# Patient Record
Sex: Male | Born: 1955 | Race: White | Hispanic: No | Marital: Single | Smoking: Never smoker
Health system: Southern US, Community
[De-identification: ages and names within clinical notes are randomized; demographics above are authoritative.]

## PROBLEM LIST (undated history)

## (undated) DIAGNOSIS — B182 Chronic viral hepatitis C: Secondary | ICD-10-CM

## (undated) DIAGNOSIS — K7460 Unspecified cirrhosis of liver: Secondary | ICD-10-CM

## (undated) HISTORY — PX: FOOT FRACTURE SURGERY: SHX645

## (undated) HISTORY — DX: Unspecified cirrhosis of liver: K74.60

## (undated) HISTORY — PX: FACIAL LACERATIONS REPAIR: SHX1571

## (undated) HISTORY — PX: COLONOSCOPY: SHX174

---

## 2001-02-02 ENCOUNTER — Encounter: Admission: RE | Admit: 2001-02-02 | Discharge: 2001-02-02 | Payer: Self-pay | Admitting: Gastroenterology

## 2001-02-02 ENCOUNTER — Encounter: Payer: Self-pay | Admitting: Gastroenterology

## 2004-02-29 ENCOUNTER — Encounter: Admission: RE | Admit: 2004-02-29 | Discharge: 2004-02-29 | Payer: Self-pay | Admitting: Gastroenterology

## 2004-07-22 ENCOUNTER — Ambulatory Visit: Payer: Self-pay | Admitting: Gastroenterology

## 2004-08-07 ENCOUNTER — Ambulatory Visit (HOSPITAL_COMMUNITY): Admission: RE | Admit: 2004-08-07 | Discharge: 2004-08-07 | Payer: Self-pay | Admitting: Gastroenterology

## 2004-08-07 ENCOUNTER — Encounter (INDEPENDENT_AMBULATORY_CARE_PROVIDER_SITE_OTHER): Payer: Self-pay | Admitting: Specialist

## 2005-07-17 ENCOUNTER — Ambulatory Visit: Payer: Self-pay | Admitting: Gastroenterology

## 2007-08-30 ENCOUNTER — Ambulatory Visit: Payer: Self-pay | Admitting: Gastroenterology

## 2008-02-16 ENCOUNTER — Ambulatory Visit: Payer: Self-pay | Admitting: Gastroenterology

## 2012-08-22 ENCOUNTER — Other Ambulatory Visit: Payer: Self-pay | Admitting: Otolaryngology

## 2012-09-20 ENCOUNTER — Other Ambulatory Visit: Payer: Self-pay | Admitting: Internal Medicine

## 2012-09-20 DIAGNOSIS — B182 Chronic viral hepatitis C: Secondary | ICD-10-CM

## 2012-09-26 ENCOUNTER — Other Ambulatory Visit (HOSPITAL_COMMUNITY): Payer: Self-pay | Admitting: Internal Medicine

## 2012-09-26 DIAGNOSIS — B182 Chronic viral hepatitis C: Secondary | ICD-10-CM

## 2012-09-26 DIAGNOSIS — R160 Hepatomegaly, not elsewhere classified: Secondary | ICD-10-CM

## 2012-09-27 ENCOUNTER — Other Ambulatory Visit: Payer: Self-pay | Admitting: Radiology

## 2012-09-27 ENCOUNTER — Ambulatory Visit
Admission: RE | Admit: 2012-09-27 | Discharge: 2012-09-27 | Disposition: A | Discharge status: Home / Self Care | Payer: BC Managed Care – PPO | Source: Ambulatory Visit | Attending: Internal Medicine | Admitting: Internal Medicine

## 2012-09-27 ENCOUNTER — Other Ambulatory Visit (HOSPITAL_COMMUNITY): Payer: Self-pay | Admitting: Internal Medicine

## 2012-09-27 DIAGNOSIS — B182 Chronic viral hepatitis C: Secondary | ICD-10-CM

## 2012-09-30 ENCOUNTER — Encounter (HOSPITAL_COMMUNITY): Payer: Self-pay

## 2012-09-30 ENCOUNTER — Ambulatory Visit (HOSPITAL_COMMUNITY)
Admission: RE | Admit: 2012-09-30 | Discharge: 2012-09-30 | Disposition: A | Discharge status: Home / Self Care | Payer: BC Managed Care – PPO | Source: Ambulatory Visit | Attending: Internal Medicine | Admitting: Internal Medicine

## 2012-09-30 DIAGNOSIS — Z87891 Personal history of nicotine dependence: Secondary | ICD-10-CM | POA: Insufficient documentation

## 2012-09-30 DIAGNOSIS — R160 Hepatomegaly, not elsewhere classified: Secondary | ICD-10-CM

## 2012-09-30 DIAGNOSIS — B182 Chronic viral hepatitis C: Secondary | ICD-10-CM | POA: Insufficient documentation

## 2012-09-30 HISTORY — DX: Chronic viral hepatitis C: B18.2

## 2012-09-30 LAB — CBC
HCT: 46.3 % (ref 39.0–52.0)
MCH: 31.6 pg (ref 26.0–34.0)
MCHC: 35 g/dL (ref 30.0–36.0)
MCV: 90.4 fL (ref 78.0–100.0)
Platelets: 163 10*3/uL (ref 150–400)
RDW: 13.1 % (ref 11.5–15.5)

## 2012-09-30 MED ORDER — SODIUM CHLORIDE 0.9 % IV SOLN
Freq: Once | INTRAVENOUS | Status: AC
  Administered 2012-09-30: 08:00:00 via INTRAVENOUS

## 2012-09-30 MED ORDER — FENTANYL CITRATE 0.05 MG/ML IJ SOLN
INTRAMUSCULAR | Status: AC | PRN
  Administered 2012-09-30: 50 ug via INTRAVENOUS

## 2012-09-30 MED ORDER — MIDAZOLAM HCL 2 MG/2ML IJ SOLN
INTRAMUSCULAR | Status: AC
  Filled 2012-09-30: qty 4

## 2012-09-30 MED ORDER — FENTANYL CITRATE 0.05 MG/ML IJ SOLN
INTRAMUSCULAR | Status: AC
  Filled 2012-09-30: qty 4

## 2012-09-30 MED ORDER — MIDAZOLAM HCL 2 MG/2ML IJ SOLN
INTRAMUSCULAR | Status: AC | PRN
  Administered 2012-09-30: 1 mg via INTRAVENOUS

## 2012-09-30 MED ORDER — OXYCODONE-ACETAMINOPHEN 5-325 MG PO TABS
2.0000 | ORAL_TABLET | Freq: Once | ORAL | Status: AC
  Administered 2012-09-30: 2 via ORAL

## 2012-09-30 MED ORDER — OXYCODONE-ACETAMINOPHEN 5-325 MG PO TABS
ORAL_TABLET | ORAL | Status: AC
  Filled 2012-09-30: qty 2

## 2012-09-30 NOTE — Procedures (Signed)
Technically successful US guided biopsy of the right lobe of the liver.  No immediate complications.

## 2012-09-30 NOTE — ED Notes (Signed)
Requested SS bed from [place], [place].  Third on list.

## 2012-09-30 NOTE — H&P (Signed)
Richard Coffey is an 57 y.o. male.   Chief Complaint: notice Liver fxn test increase on routine PE 7 ys ago Was dx with Hep C then Was placed on Interferon injections x 11 months Followed off and on  Now with Dr Richard Coffey and referred to GI MD Scheduled now for liver core biopsy HPI: Hep C  Past Medical History  Diagnosis Date  . Hep C w/o coma, chronic     Past Surgical History  Procedure Laterality Date  . Facial lacerations repair    . Foot fracture surgery      History reviewed. No pertinent family history. Social History:  reports that he has quit smoking. He does not have any smokeless tobacco history on file. His alcohol and drug histories are not on file.  Allergies: No Known Allergies   (Not in a hospital admission)  Results for orders placed during the hospital encounter of 09/30/12 (from the past 48 hour(s))  APTT     Status: None   Collection Time    09/30/12  8:08 AM      Result Value Range   aPTT 30  24 - 37 seconds  CBC     Status: None   Collection Time    09/30/12  8:08 AM      Result Value Range   WBC 6.2  4.0 - 10.5 K/uL   RBC 5.12  4.22 - 5.81 MIL/uL   Hemoglobin 16.2  13.0 - 17.0 g/dL   HCT 57.3  22.0 - 25.4 %   MCV 90.4  78.0 - 100.0 fL   MCH 31.6  26.0 - 34.0 pg   MCHC 35.0  30.0 - 36.0 g/dL   RDW 27.0  62.3 - 76.2 %   Platelets 163  150 - 400 K/uL  PROTIME-INR     Status: None   Collection Time    09/30/12  8:08 AM      Result Value Range   Prothrombin Time 12.5  11.6 - 15.2 seconds   INR 0.94  0.00 - 1.49   No results found.  Review of Systems  Constitutional: Negative for fever.  Respiratory: Positive for shortness of breath.   Cardiovascular: Negative for chest pain.  Gastrointestinal: Negative for nausea and vomiting.  Neurological: Negative for weakness and headaches.    Blood pressure 112/73, pulse 70, temperature 97.3 F (36.3 C), temperature source Oral, resp. rate 18, height 6\' 1"  (1.854 m), weight 196 lb (88.905 kg),  SpO2 96.00%. Physical Exam  Constitutional: He is oriented to person, place, and time.  Cardiovascular: Normal rate, regular rhythm and normal heart sounds.   No murmur heard. Respiratory: Effort normal and breath sounds normal. He has no wheezes.  GI: Soft. Bowel sounds are normal. There is no tenderness.  Musculoskeletal: Normal range of motion.  Neurological: He is alert and oriented to person, place, and time. No cranial nerve deficit.  Psychiatric: He has a normal mood and affect. His behavior is normal. Judgment and thought content normal.     Assessment/Plan Hep C Scheduled for liver core bx now Pt aware of procedure benefits and risks and agreeable to proceed Consent signed and in chart  Richard Coffey A 09/30/2012, 9:27 AM

## 2013-06-22 IMAGING — US US ABDOMEN LIMITED
1 series · 14 of 25 positions shown · non-contrast
Comparison: Abdominal ultrasound 02/29/2004.

CLINICAL DATA: Chronic hepatitis C infection.  Hepatic
surveillance.

LIMITED ABDOMINAL ULTRASOUND - RIGHT UPPER QUADRANT

[Series 1: us abdomen limited · 0.26mm/px · 14 of 50 slices shown]
[im 1/50]
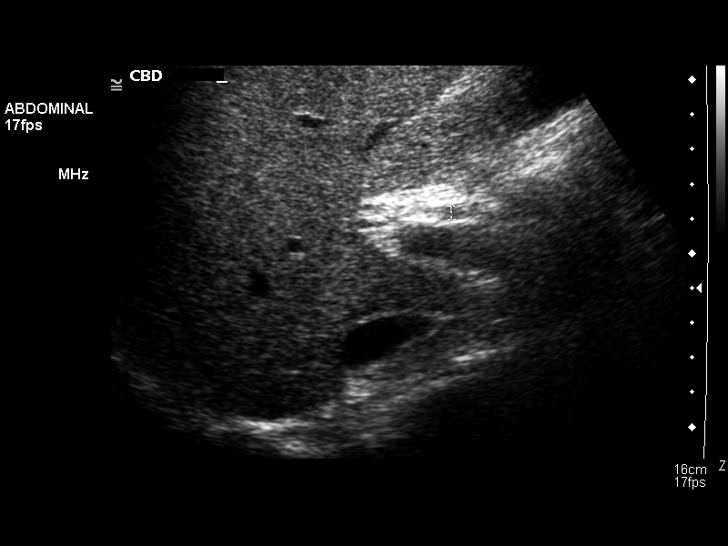
[im 5/50]
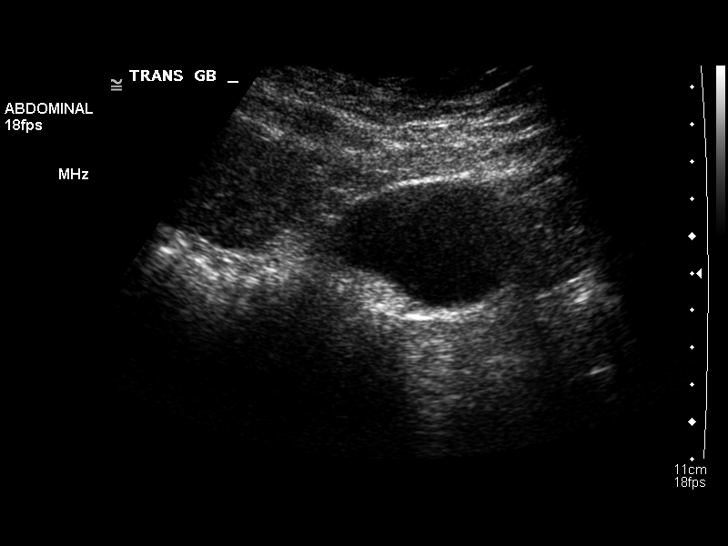
[im 9/50]
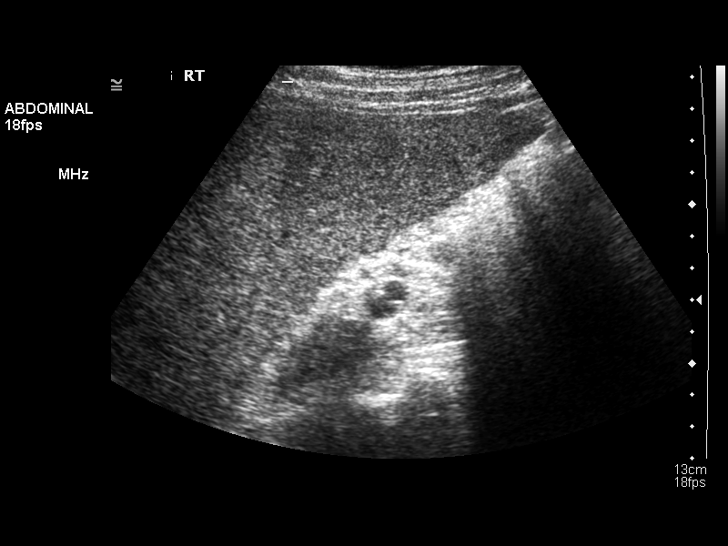
[im 13/50]
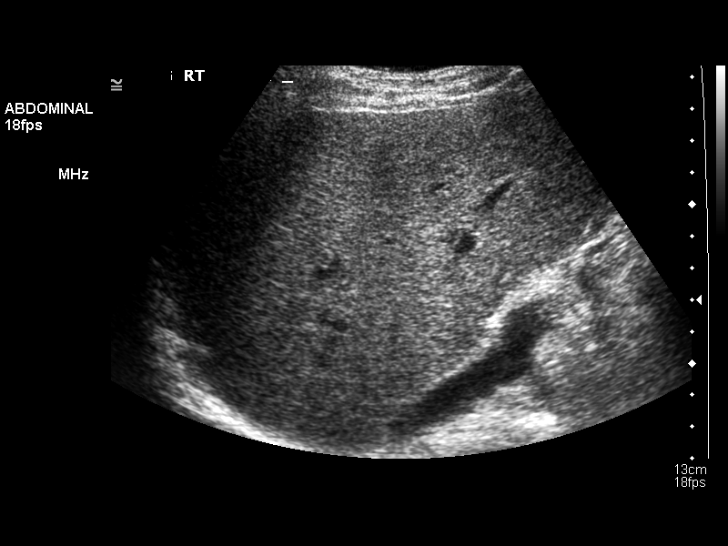
[im 17/50]
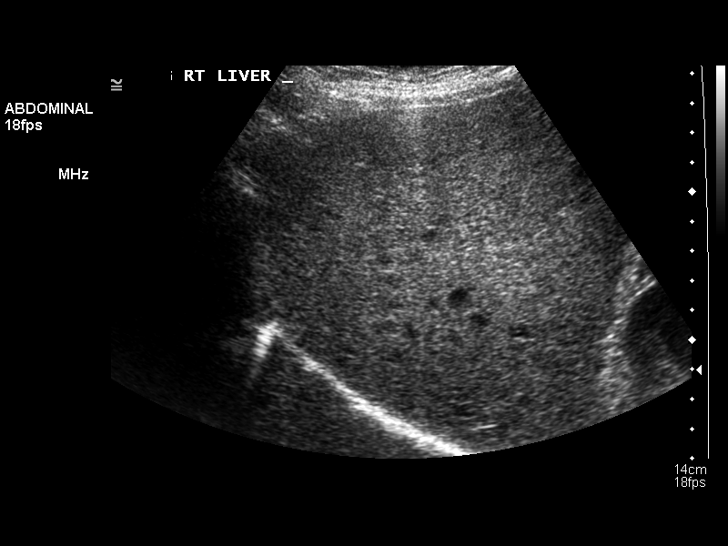
[im 19/50]
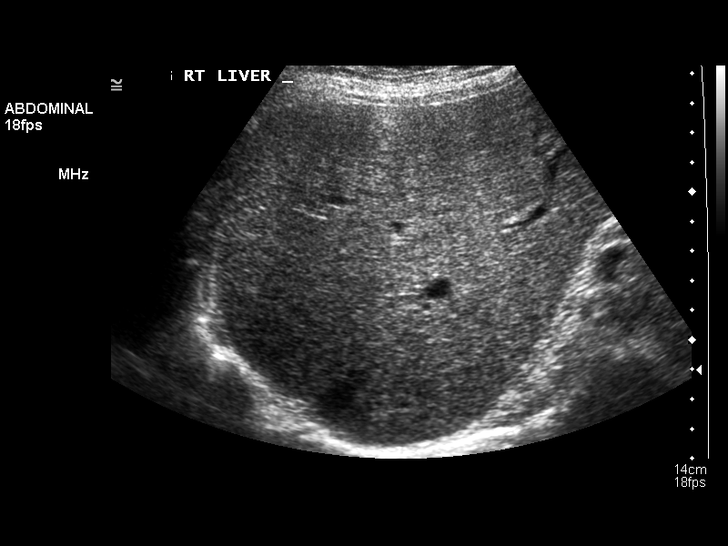
[im 23/50]
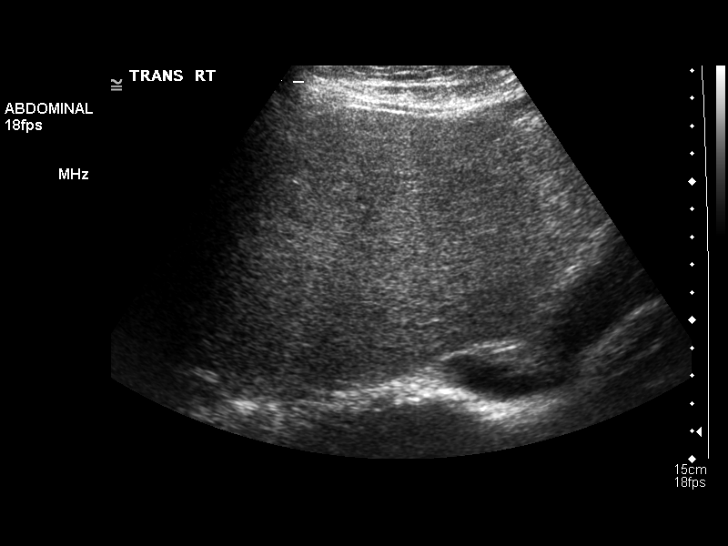
[im 27/50]
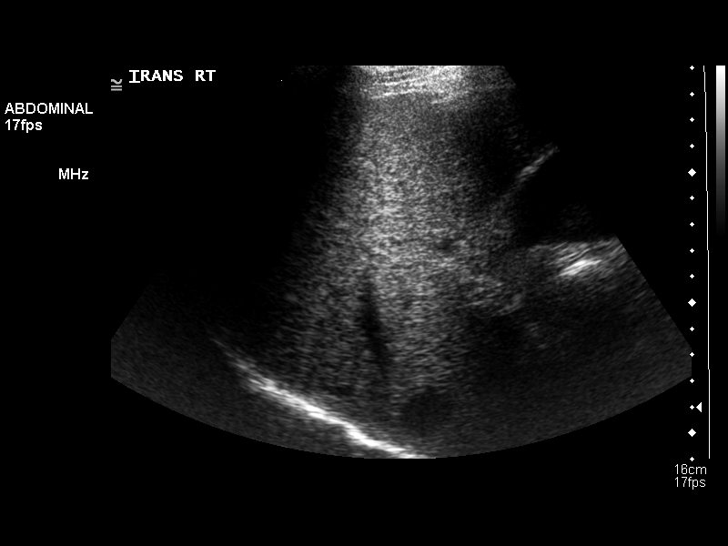
[im 31/50]
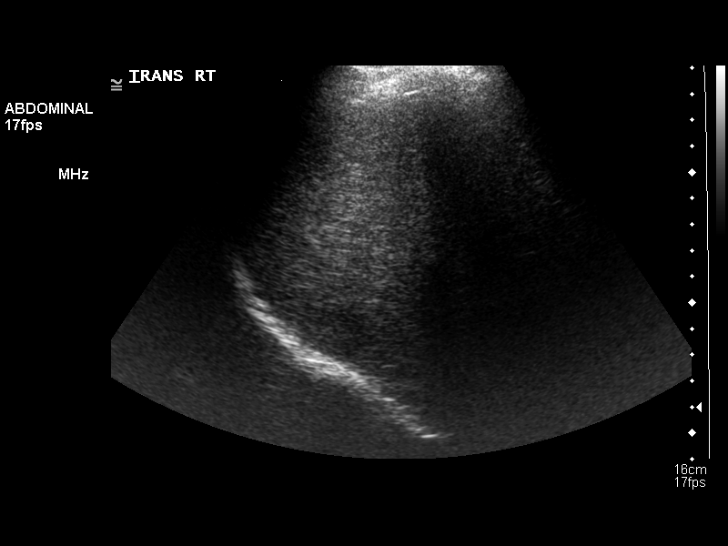
[im 33/50]
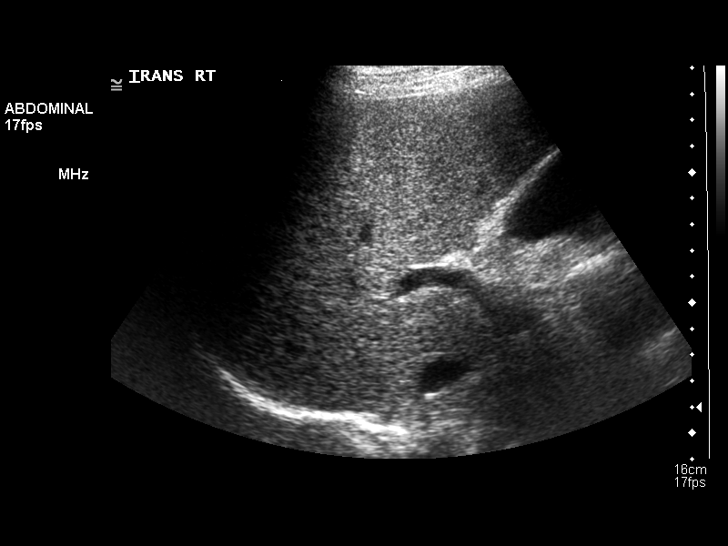
[im 37/50]
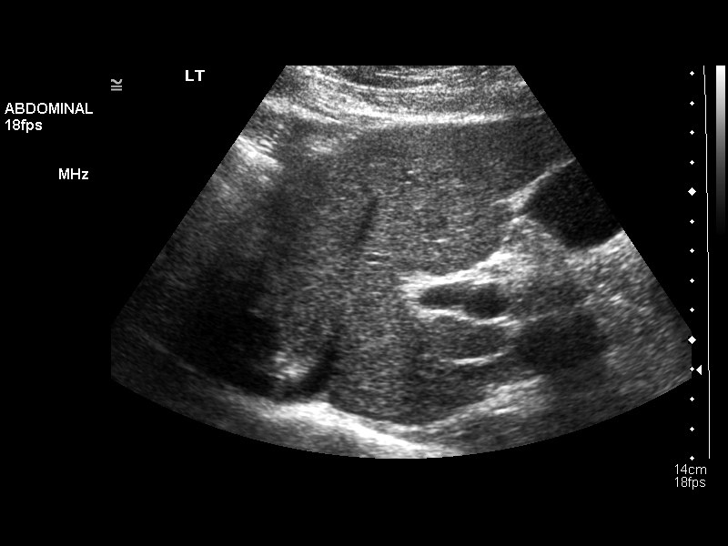
[im 41/50]
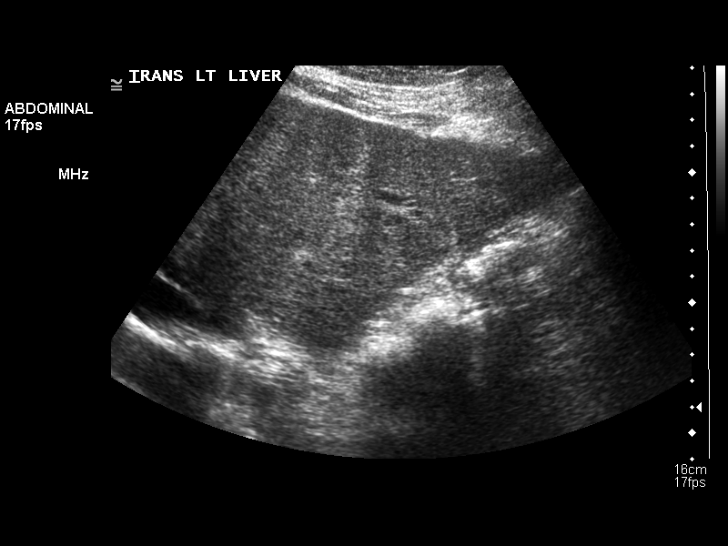
[im 45/50]
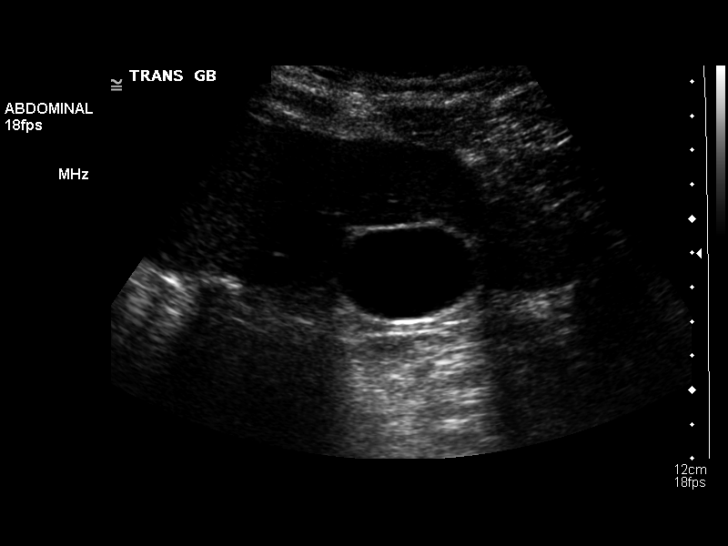
[im 50/50]
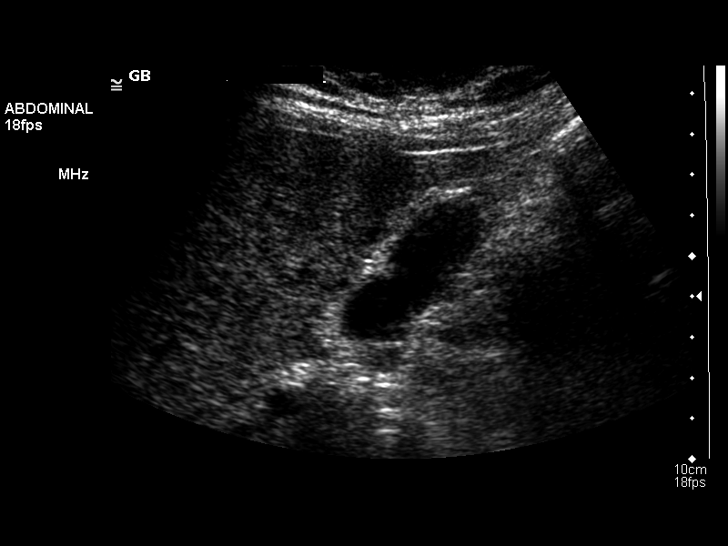

[14 of 25 positions shown; findings below may reference images not displayed]

FINDINGS: Gallbladder:  The gallbladder is well distended.  No gallstones are
identified.  There is no gallbladder wall thickening or sonographic
Murphy's sign.

Common bile duct:  Normal in caliber without filling defects.

Liver:  The hepatic echotexture is mildly heterogeneous without
focal lesion.  There is mild hepatic contour irregularity.  No
significant perihepatic ascites is identified.
IMPRESSION: Mild hepatic contour irregularity and heterogeneity consistent with
cirrhosis.  No dominant lesion identified by ultrasound.

## 2013-08-09 ENCOUNTER — Other Ambulatory Visit: Payer: Self-pay | Admitting: Internal Medicine

## 2013-08-09 DIAGNOSIS — C220 Liver cell carcinoma: Secondary | ICD-10-CM

## 2013-08-10 ENCOUNTER — Ambulatory Visit
Admission: RE | Admit: 2013-08-10 | Discharge: 2013-08-10 | Disposition: A | Discharge status: Home / Self Care | Payer: BC Managed Care – PPO | Source: Ambulatory Visit | Attending: Internal Medicine | Admitting: Internal Medicine

## 2013-08-10 DIAGNOSIS — C220 Liver cell carcinoma: Secondary | ICD-10-CM

## 2013-11-22 ENCOUNTER — Other Ambulatory Visit: Payer: Self-pay | Admitting: Nurse Practitioner

## 2013-11-22 DIAGNOSIS — C220 Liver cell carcinoma: Secondary | ICD-10-CM

## 2013-11-28 ENCOUNTER — Ambulatory Visit
Admission: RE | Admit: 2013-11-28 | Discharge: 2013-11-28 | Disposition: A | Discharge status: Home / Self Care | Payer: BC Managed Care – PPO | Source: Ambulatory Visit | Attending: Nurse Practitioner | Admitting: Nurse Practitioner

## 2013-11-28 DIAGNOSIS — C220 Liver cell carcinoma: Secondary | ICD-10-CM

## 2014-09-04 ENCOUNTER — Other Ambulatory Visit (HOSPITAL_COMMUNITY): Payer: Self-pay | Admitting: Nurse Practitioner

## 2014-09-04 DIAGNOSIS — B182 Chronic viral hepatitis C: Secondary | ICD-10-CM

## 2014-09-05 ENCOUNTER — Ambulatory Visit (HOSPITAL_COMMUNITY): Admission: RE | Admit: 2014-09-05 | Payer: BLUE CROSS/BLUE SHIELD | Source: Ambulatory Visit

## 2014-10-09 ENCOUNTER — Ambulatory Visit (HOSPITAL_COMMUNITY): Payer: BLUE CROSS/BLUE SHIELD

## 2014-12-26 ENCOUNTER — Other Ambulatory Visit: Payer: Self-pay | Admitting: Nurse Practitioner

## 2014-12-26 DIAGNOSIS — C220 Liver cell carcinoma: Secondary | ICD-10-CM

## 2014-12-28 ENCOUNTER — Ambulatory Visit
Admission: RE | Admit: 2014-12-28 | Discharge: 2014-12-28 | Disposition: A | Discharge status: Home / Self Care | Payer: BLUE CROSS/BLUE SHIELD | Source: Ambulatory Visit | Attending: Nurse Practitioner | Admitting: Nurse Practitioner

## 2014-12-28 DIAGNOSIS — C220 Liver cell carcinoma: Secondary | ICD-10-CM

## 2015-01-22 ENCOUNTER — Encounter: Payer: Self-pay | Admitting: Physician Assistant

## 2015-02-05 ENCOUNTER — Other Ambulatory Visit (INDEPENDENT_AMBULATORY_CARE_PROVIDER_SITE_OTHER): Payer: BLUE CROSS/BLUE SHIELD

## 2015-02-05 ENCOUNTER — Encounter: Payer: Self-pay | Admitting: Physician Assistant

## 2015-02-05 ENCOUNTER — Ambulatory Visit (INDEPENDENT_AMBULATORY_CARE_PROVIDER_SITE_OTHER): Payer: BLUE CROSS/BLUE SHIELD | Admitting: Physician Assistant

## 2015-02-05 VITALS — BP 110/72 | HR 56 | Ht 73.0 in | Wt 194.6 lb

## 2015-02-05 DIAGNOSIS — K7469 Other cirrhosis of liver: Secondary | ICD-10-CM | POA: Diagnosis not present

## 2015-02-05 DIAGNOSIS — B1920 Unspecified viral hepatitis C without hepatic coma: Secondary | ICD-10-CM

## 2015-02-05 LAB — HEPATIC FUNCTION PANEL
ALT: 132 U/L — ABNORMAL HIGH (ref 0–53)
AST: 100 U/L — AB (ref 0–37)
Albumin: 3.9 g/dL (ref 3.5–5.2)
Alkaline Phosphatase: 83 U/L (ref 39–117)
Bilirubin, Direct: 0.1 mg/dL (ref 0.0–0.3)
Total Bilirubin: 0.7 mg/dL (ref 0.2–1.2)
Total Protein: 7.7 g/dL (ref 6.0–8.3)

## 2015-02-05 LAB — PROTIME-INR
INR: 1 ratio (ref 0.8–1.0)
Prothrombin Time: 11.5 s (ref 9.6–13.1)

## 2015-02-05 NOTE — Patient Instructions (Signed)
You have been scheduled for an endoscopy. Please follow written instructions given to you at your visit today. If you use inhalers (even only as needed), please bring them with you on the day of your procedure. Your physician has requested that you go to www.startemmi.com and enter the access code given to you at your visit today. This web site gives a general overview about your procedure. However, you should still follow specific instructions given to you by our office regarding your preparation for the procedure.  Your physician has requested that you go to the basement for lab work before leaving today.   
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
## 2015-02-06 ENCOUNTER — Encounter: Payer: Self-pay | Admitting: Internal Medicine

## 2015-02-06 ENCOUNTER — Ambulatory Visit (AMBULATORY_SURGERY_CENTER): Payer: BLUE CROSS/BLUE SHIELD | Admitting: Internal Medicine

## 2015-02-06 VITALS — BP 97/69 | HR 61 | Temp 97.4°F | Resp 14 | Ht 73.0 in | Wt 194.0 lb

## 2015-02-06 DIAGNOSIS — K7469 Other cirrhosis of liver: Secondary | ICD-10-CM | POA: Diagnosis not present

## 2015-02-06 DIAGNOSIS — B182 Chronic viral hepatitis C: Secondary | ICD-10-CM

## 2015-02-06 MED ORDER — SODIUM CHLORIDE 0.9 % IV SOLN: 500.0000 mL | INTRAVENOUS | Status: DC

## 2015-02-06 NOTE — Op Note (Signed)
[place]  [address] [place], [address]   ENDOSCOPY PROCEDURE REPORT  PATIENT: Talton, Delpriore  MR#: [id number] BIRTHDATE: 1955/11/03 , 74  yrs. old GENDER: male ENDOSCOPIST: Jerene Bears, MD REFERRED BY:   [name], NP PROCEDURE DATE:  02/06/2015 PROCEDURE:  EGD, screening ASA CLASS:     Class III INDICATIONS:  variceal screening, history of hepatitis C cirrhosis.  MEDICATIONS: Monitored anesthesia care and Propofol 200 mg IV TOPICAL ANESTHETIC: none  DESCRIPTION OF PROCEDURE: After the risks benefits and alternatives of the procedure were thoroughly explained, informed consent was obtained.  The LB [id number] endoscope was introduced through the mouth and advanced to the second portion of the duodenum , Without limitations.  The instrument was slowly withdrawn as the mucosa was fully examined.      EXAM: The esophagus and gastroesophageal junction were completely normal in appearance.  The stomach was entered and closely examined.The antrum, angularis, and lesser curvature were well visualized, including a retroflexed view of the cardia and fundus. The stomach wall was normally distensible.  The scope passed easily through the pylorus into the duodenum.  Retroflexed views revealed no abnormalities.     The scope was then withdrawn from the patient and the procedure completed.  COMPLICATIONS: There were no immediate complications.  ENDOSCOPIC IMPRESSION: Normal EGD with no evidence of gastric or esophageal varices  RECOMMENDATIONS: Repeat EGD in 3 years for variceal screening   eSigned:  Jerene Bears, MD 02/06/2015 2:15 PM    HW:YSHUOH, Denton Ar MD and The Patient, [name], NP

## 2015-02-06 NOTE — Patient Instructions (Signed)
YOU HAD AN ENDOSCOPIC PROCEDURE TODAY AT [facility]:   Refer to the procedure report that was given to you for any specific questions about what was found during the examination.  If the procedure report does not answer your questions, please call your gastroenterologist to clarify.  If you requested that your care partner not be given the details of your procedure findings, then the procedure report has been included in a sealed envelope for you to review at your convenience later.  YOU SHOULD EXPECT: Some feelings of bloating in the abdomen. Passage of more gas than usual.  Walking can help get rid of the air that was put into your GI tract during the procedure and reduce the bloating. If you had a lower endoscopy (such as a colonoscopy or flexible sigmoidoscopy) you may notice spotting of blood in your stool or on the toilet paper. If you underwent a bowel prep for your procedure, you may not have a normal bowel movement for a few days.  Please Note:  You might notice some irritation and congestion in your nose or some drainage.  This is from the oxygen used during your procedure.  There is no need for concern and it should clear up in a day or so.  SYMPTOMS TO REPORT IMMEDIATELY:     Following upper endoscopy (EGD)  Vomiting of blood or coffee ground material  New chest pain or pain under the shoulder blades  Painful or persistently difficult swallowing  New shortness of breath  Fever of 100F or higher  Black, tarry-looking stools  For urgent or emergent issues, a gastroenterologist can be reached at any hour by calling [phone].   DIET: Your first meal following the procedure should be a small meal and then it is ok to progress to your normal diet. Heavy or fried foods are harder to digest and may make you feel nauseous or bloated.  Likewise, meals heavy in dairy and vegetables can increase bloating.  Drink plenty of fluids but you should avoid alcoholic beverages  for 24 hours.  ACTIVITY:  You should plan to take it easy for the rest of today and you should NOT DRIVE or use heavy machinery until tomorrow (because of the sedation medicines used during the test).    FOLLOW UP: Our staff will call the number listed on your records the next business day following your procedure to check on you and address any questions or concerns that you may have regarding the information given to you following your procedure. If we do not reach you, we will leave a message.  However, if you are feeling well and you are not experiencing any problems, there is no need to return our call.  We will assume that you have returned to your regular daily activities without incident.  If any biopsies were taken you will be contacted by phone or by letter within the next 1-3 weeks.  Please call us at [phone] if you have not heard about the biopsies in 3 weeks.    SIGNATURES/CONFIDENTIALITY: You and/or your care partner have signed paperwork which will be entered into your electronic medical record.  These signatures attest to the fact that that the information above on your After Visit Summary has been reviewed and is understood.  Full responsibility of the confidentiality of this discharge information lies with you and/or your care-partner.  Repeat EGD in 3 years.

## 2015-02-06 NOTE — Progress Notes (Signed)
Transferred to recovery room. A/O x3, pleased with MAC.  VSS.  Report to Jane, RN. 

## 2015-02-07 ENCOUNTER — Telehealth: Payer: Self-pay | Admitting: *Deleted

## 2015-02-07 NOTE — Telephone Encounter (Signed)
  Follow up Call-  Call back number 02/06/2015  Post procedure Call Back phone  # [phone]  Permission to leave phone message Yes     Patient questions:  Do you have a fever, pain , or abdominal swelling? No. Pain Score  0 *  Have you tolerated food without any problems? Yes.    Have you been able to return to your normal activities? Yes.    Do you have any questions about your discharge instructions: Diet   No. Medications  No. Follow up visit  No.  Do you have questions or concerns about your Care? No.  Actions: * If pain score is 4 or above: No action needed, pain <4.

## 2015-04-22 ENCOUNTER — Ambulatory Visit (INDEPENDENT_AMBULATORY_CARE_PROVIDER_SITE_OTHER): Payer: BLUE CROSS/BLUE SHIELD | Admitting: Emergency Medicine

## 2015-04-22 VITALS — BP 122/74 | HR 85 | Temp 98.3°F | Resp 17 | Ht 71.0 in | Wt 200.0 lb

## 2015-04-22 DIAGNOSIS — R1310 Dysphagia, unspecified: Secondary | ICD-10-CM

## 2015-04-22 NOTE — Progress Notes (Signed)
Subjective:  Patient ID: Richard Coffey, male    DOB: 1955-11-09  Age: 59 y.o. MRN: [id number]  CC: Sore Throat   HPI Richard Coffey presents  with a sensation that he has a swelling in the throat that's "like swollen tonsils". He has no dysphagia or weight loss his appetite is normal. That his symptoms began after he had an upper endoscopy. Michela Pitcher that he has a fullness just about the level of his thyroid actually way describes it. He has no symptoms suggestive of reflux. No indigestion or heartburn nausea vomiting cough or water brash  History Jakhari has a past medical history of Hep C w/o coma, chronic ([place]) and Cirrhosis ([place]).   He has past surgical history that includes Facial lacerations repair; Foot fracture surgery; and Colonoscopy.   His  family history includes Lung cancer in his father.  He   reports that he has never smoked. He has never used smokeless tobacco. He reports that he does not drink alcohol or use illicit drugs.  No outpatient prescriptions prior to visit.   No facility-administered medications prior to visit.    Social History   Social History  . Marital Status: Single    Spouse Name: N/A  . Number of Children: 1  . Years of Education: N/A   Social History Main Topics  . Smoking status: Never Smoker   . Smokeless tobacco: Never Used  . Alcohol Use: No     Comment: Quit 10 years ago  . Drug Use: No  . Sexual Activity: Not Asked   Other Topics Concern  . None   Social History Narrative     Review of Systems  Constitutional: Negative for fever, chills and appetite change.  HENT: Negative for congestion, ear pain, postnasal drip, sinus pressure and sore throat.   Eyes: Negative for pain and redness.  Respiratory: Negative for cough, shortness of breath and wheezing.   Cardiovascular: Negative for leg swelling.  Gastrointestinal: Negative for nausea, vomiting, abdominal pain, diarrhea, constipation and blood in stool.  Endocrine:  Negative for polyuria.  Genitourinary: Negative for dysuria, urgency, frequency and flank pain.  Musculoskeletal: Negative for gait problem.  Skin: Negative for rash.  Neurological: Negative for weakness and headaches.  Psychiatric/Behavioral: Negative for confusion and decreased concentration. The patient is not nervous/anxious.     Objective:  BP 122/74 mmHg  Pulse 85  Temp(Src) 98.3 F (36.8 C) (Oral)  Resp 17  Ht 5\' 11"  (1.803 m)  Wt 200 lb (90.719 kg)  BMI 27.91 kg/m2  SpO2 97%  Physical Exam  Constitutional: He is oriented to person, place, and time. He appears well-developed and well-nourished. No distress.  HENT:  Head: Normocephalic and atraumatic.  Right Ear: External ear normal.  Left Ear: External ear normal.  Nose: Nose normal.  Eyes: Conjunctivae and EOM are normal. Pupils are equal, round, and reactive to light. No scleral icterus.  Neck: Normal range of motion. Neck supple. No tracheal deviation present.  Cardiovascular: Normal rate, regular rhythm and normal heart sounds.   Pulmonary/Chest: Effort normal. No respiratory distress. He has no wheezes. He has no rales.  Abdominal: He exhibits no mass. There is no tenderness. There is no rebound and no guarding.  Musculoskeletal: He exhibits no edema.  Lymphadenopathy:    He has no cervical adenopathy.  Neurological: He is alert and oriented to person, place, and time. Coordination normal.  Skin: Skin is warm and dry. No rash noted.  Psychiatric: He has a normal  mood and affect. His behavior is normal.      Assessment & Plan:   Neo was seen today for sore throat.  Diagnoses and all orders for this visit:  Dysphagia  Mr. Huitron does not currently have medications on file.  No orders of the defined types were placed in this encounter.    Appropriate red flag conditions were discussed with the patient as well as actions that should be taken.  Patient expressed his understanding.  Follow-up: Return  if symptoms worsen or fail to improve.  Roselee Culver, MD

## 2015-04-22 NOTE — Patient Instructions (Signed)
Hepatitis C  Hepatitis C is a viral infection of the liver. It can lead to scarring of the liver (cirrhosis), liver failure, or liver cancer. Hepatitis C may go undetected for months or years because people with the infection may not have symptoms, or they may have only mild symptoms.  CAUSES   Hepatitis C is caused by the hepatitis C virus (HCV). The virus can be passed from one person to another through:   Blood.   Contaminated needles, such as those used for tattooing, body piercing, acupuncture, or injecting drugs.   Having unprotected sex with an infected person.   Childbirth.   Blood transfusions or organ transplants done in the [place] before 1992.  RISK FACTORS  Risk factors for hepatitis C include:   Having unprotected sex with an infected person.   Using illegal drugs.  SIGNS AND SYMPTOMS   Symptoms of hepatitis C may include:   Fatigue.   Loss of appetite.   Nausea.   Vomiting.   Abdominal pain.   Dark yellow urine.   Yellowish skin and eyes (jaundice).   Itching of the skin.   Clay-colored bowel movements.   Joint pain.  Symptoms are not always present.   DIAGNOSIS   Hepatitis C is diagnosed with blood tests. Other types of tests may also be done to check how your liver is functioning.  TREATMENT   Your health care provider may perform noninvasive tests or a liver biopsy to help determine the best course of treatment. Treatment for hepatitis C may include one or more medicines. Your health care provider may check you for a recurring infection or other liver conditions every 6-12 months after treatment.  HOME CARE INSTRUCTIONS    Rest as needed.   Take all medicines as directed by your health care provider.   Do not take any medicine unless approved by your health care provider. This includes over-the-counter medicine and birth control pills.   Do not drink alcohol.   Do not have sex until approved by your health care provider.   Do not share toothbrushes, nail clippers,  razors, or needles.  PREVENTION  There is no vaccine for hepatitis C. The only way to prevent the disease is to reduce the risk of exposure to the virus. This may be done by:   Practicing safe sex and using condoms.   Avoiding illegal drugs.  SEEK MEDICAL CARE IF:   You have a fever.   You develop abdominal pain.   You develop dark urine.   You have clay-colored bowel movements.   You develop joint pains.  SEEK IMMEDIATE MEDICAL CARE IF:   You have increasing fatigue or weakness.   You lose your appetite.   You feel nauseous or vomit.   You develop jaundice or your jaundice gets worse.   You bruise or bleed easily.  MAKE SURE YOU:    Understand these instructions.   Will watch your condition.   Will get help right away if you are not doing well or get worse.     This information is not intended to replace advice given to you by your health care provider. Make sure you discuss any questions you have with your health care provider.     Document Released: 06/19/2000 Document Revised: 07/13/2014 Document Reviewed: 10/04/2013  Elsevier Interactive Patient Education 2016 [employer]

## 2015-05-20 ENCOUNTER — Ambulatory Visit (INDEPENDENT_AMBULATORY_CARE_PROVIDER_SITE_OTHER): Payer: BLUE CROSS/BLUE SHIELD | Admitting: Internal Medicine

## 2015-05-20 ENCOUNTER — Encounter: Payer: Self-pay | Admitting: Internal Medicine

## 2015-05-20 VITALS — BP 116/68 | HR 68 | Ht 71.0 in | Wt 201.2 lb

## 2015-05-20 DIAGNOSIS — K7460 Unspecified cirrhosis of liver: Secondary | ICD-10-CM | POA: Diagnosis not present

## 2015-05-20 DIAGNOSIS — F458 Other somatoform disorders: Secondary | ICD-10-CM

## 2015-05-20 DIAGNOSIS — B182 Chronic viral hepatitis C: Secondary | ICD-10-CM | POA: Diagnosis not present

## 2015-05-20 DIAGNOSIS — R0989 Other specified symptoms and signs involving the circulatory and respiratory systems: Secondary | ICD-10-CM

## 2015-05-20 DIAGNOSIS — R198 Other specified symptoms and signs involving the digestive system and abdomen: Secondary | ICD-10-CM

## 2015-05-20 NOTE — Patient Instructions (Signed)
Please follow up as needed.  Call our office if you notice the lump in your throat again.

## 2015-05-20 NOTE — Progress Notes (Signed)
   Subjective:    Patient ID: Richard Coffey, male    DOB: 1955-08-06, 59 y.o.   MRN: [id number]  HPI [name] is a 59 year old male with a past medical history of hepatitis C cirrhosis followed by [facility] who is seen in follow-up to evaluate globus sensation. He was seen in consultation at the request of [name], NP for upper endoscopy to evaluate and screen for varices. This exam was performed on 02/06/2015. It was normal with no evidence of varices. He reports after this time he developed globus sensation and a mild sore throat. Globus sensation persisted for nearly 3 months. He was seen in urgent care and told to come back to GI. During this time he was having throat clearing but no true dysphagia or odynophagia. Symptoms became more intermittent and over the last several weeks of been gone completely. He now feels well but wanted to follow-up because he was concerned during the months he was having issues with globus. He denies heartburn currently. Has not lost weight. No nausea or vomiting. No early satiety. Bowel movements been regular without blood in his stool or melena.   Review of Systems As per history of present illness, otherwise negative  Current Medications, Allergies, Past Medical History, Past Surgical History, Family History and Social History were reviewed in [employer] record.     Objective:   Physical Exam BP 116/68 mmHg  Pulse 68  Ht 5\' 11"  (1.803 m)  Wt 201 lb 4 oz (91.286 kg)  BMI 28.08 kg/m2 Constitutional: Well-developed and well-nourished. No distress. HEENT: Normocephalic and atraumatic. Oropharynx is clear and moist. No oropharyngeal exudate. Conjunctivae are normal.  No scleral icterus. Neck: Neck supple. Trachea midline. Cardiovascular: Normal rate, regular rhythm and intact distal pulses. No M/R/G Pulmonary/chest: Effort normal and breath sounds normal. No wheezing, rales or rhonchi. Abdominal: Soft, nontender,  nondistended. Bowel sounds active throughout.  Extremities: no clubbing, cyanosis, or edema Lymphadenopathy: No cervical adenopathy noted. Neurological: Alert and oriented to person place and time. Skin: Skin is warm and dry. No rashes noted. Psychiatric: Normal mood and affect. Behavior is normal.     Assessment & Plan:   59 year old male with a past medical history of hepatitis C cirrhosis followed by [facility] who is seen in follow-up to evaluate globus sensation.  1. Globus sensation -- this is likely a manifestation of reflux though it has resolved entirely at present. We discussed globus including etiologies. He is not having heartburn and given symptoms have resolved I will not start PPI. He is asked to notify me should symptoms recur. He voices understanding and feels reassured.  2. Hepatitis C cirrhosis -- up-to-date with variceal screening, repeat EGD recommended in 3 years. No varices seen recently. He will continue to follow with [facility] liver clinic for this issue  3. CRC screening -- up-to-date with screening colonoscopy. No polyps were found on screening exam and he was advised 10 year follow-up.  Follow-up as needed, sooner if necessary

## 2015-06-26 ENCOUNTER — Other Ambulatory Visit: Payer: Self-pay | Admitting: Nurse Practitioner

## 2015-06-26 DIAGNOSIS — K7460 Unspecified cirrhosis of liver: Secondary | ICD-10-CM

## 2015-07-02 ENCOUNTER — Ambulatory Visit
Admission: RE | Admit: 2015-07-02 | Discharge: 2015-07-02 | Disposition: A | Discharge status: Home / Self Care | Payer: BLUE CROSS/BLUE SHIELD | Source: Ambulatory Visit | Attending: Nurse Practitioner | Admitting: Nurse Practitioner

## 2015-07-02 DIAGNOSIS — K7460 Unspecified cirrhosis of liver: Secondary | ICD-10-CM

## 2015-12-05 DIAGNOSIS — K7469 Other cirrhosis of liver: Secondary | ICD-10-CM | POA: Diagnosis not present

## 2015-12-05 DIAGNOSIS — B182 Chronic viral hepatitis C: Secondary | ICD-10-CM | POA: Diagnosis not present

## 2015-12-06 ENCOUNTER — Other Ambulatory Visit: Payer: Self-pay | Admitting: Nurse Practitioner

## 2015-12-06 DIAGNOSIS — K7460 Unspecified cirrhosis of liver: Secondary | ICD-10-CM

## 2015-12-10 ENCOUNTER — Ambulatory Visit
Admission: RE | Admit: 2015-12-10 | Discharge: 2015-12-10 | Disposition: A | Discharge status: Home / Self Care | Payer: BLUE CROSS/BLUE SHIELD | Source: Ambulatory Visit | Attending: Nurse Practitioner | Admitting: Nurse Practitioner

## 2015-12-10 DIAGNOSIS — K7460 Unspecified cirrhosis of liver: Secondary | ICD-10-CM

## 2016-06-01 DIAGNOSIS — R7301 Impaired fasting glucose: Secondary | ICD-10-CM | POA: Diagnosis not present

## 2016-06-01 DIAGNOSIS — Z23 Encounter for immunization: Secondary | ICD-10-CM | POA: Diagnosis not present

## 2016-06-01 DIAGNOSIS — Z0000 Encounter for general adult medical examination without abnormal findings: Secondary | ICD-10-CM | POA: Diagnosis not present

## 2016-06-01 DIAGNOSIS — E782 Mixed hyperlipidemia: Secondary | ICD-10-CM | POA: Diagnosis not present

## 2016-06-01 DIAGNOSIS — Z125 Encounter for screening for malignant neoplasm of prostate: Secondary | ICD-10-CM | POA: Diagnosis not present

## 2016-06-08 ENCOUNTER — Other Ambulatory Visit: Payer: Self-pay | Admitting: Nurse Practitioner

## 2016-06-08 DIAGNOSIS — K7469 Other cirrhosis of liver: Secondary | ICD-10-CM

## 2016-06-08 DIAGNOSIS — B182 Chronic viral hepatitis C: Secondary | ICD-10-CM | POA: Diagnosis not present

## 2016-06-16 ENCOUNTER — Ambulatory Visit
Admission: RE | Admit: 2016-06-16 | Discharge: 2016-06-16 | Disposition: A | Discharge status: Home / Self Care | Payer: BLUE CROSS/BLUE SHIELD | Source: Ambulatory Visit | Attending: Nurse Practitioner | Admitting: Nurse Practitioner

## 2016-06-16 DIAGNOSIS — K7460 Unspecified cirrhosis of liver: Secondary | ICD-10-CM | POA: Diagnosis not present

## 2016-06-16 DIAGNOSIS — K7469 Other cirrhosis of liver: Secondary | ICD-10-CM

## 2016-07-30 DIAGNOSIS — B182 Chronic viral hepatitis C: Secondary | ICD-10-CM | POA: Diagnosis not present

## 2016-08-04 DIAGNOSIS — K7469 Other cirrhosis of liver: Secondary | ICD-10-CM | POA: Diagnosis not present

## 2016-08-04 DIAGNOSIS — B182 Chronic viral hepatitis C: Secondary | ICD-10-CM | POA: Diagnosis not present

## 2016-09-24 DIAGNOSIS — B182 Chronic viral hepatitis C: Secondary | ICD-10-CM | POA: Diagnosis not present

## 2016-12-17 DIAGNOSIS — B182 Chronic viral hepatitis C: Secondary | ICD-10-CM | POA: Diagnosis not present

## 2016-12-28 DIAGNOSIS — K7460 Unspecified cirrhosis of liver: Secondary | ICD-10-CM | POA: Diagnosis not present

## 2016-12-28 DIAGNOSIS — B182 Chronic viral hepatitis C: Secondary | ICD-10-CM | POA: Diagnosis not present

## 2016-12-29 ENCOUNTER — Other Ambulatory Visit: Payer: Self-pay | Admitting: Nurse Practitioner

## 2016-12-29 DIAGNOSIS — K7469 Other cirrhosis of liver: Secondary | ICD-10-CM

## 2017-01-07 ENCOUNTER — Ambulatory Visit
Admission: RE | Admit: 2017-01-07 | Discharge: 2017-01-07 | Disposition: A | Discharge status: Home / Self Care | Payer: BLUE CROSS/BLUE SHIELD | Source: Ambulatory Visit | Attending: Nurse Practitioner | Admitting: Nurse Practitioner

## 2017-01-07 DIAGNOSIS — K7469 Other cirrhosis of liver: Secondary | ICD-10-CM

## 2017-01-07 DIAGNOSIS — K743 Primary biliary cirrhosis: Secondary | ICD-10-CM | POA: Diagnosis not present

## 2017-06-04 DIAGNOSIS — Z125 Encounter for screening for malignant neoplasm of prostate: Secondary | ICD-10-CM | POA: Diagnosis not present

## 2017-06-04 DIAGNOSIS — Z23 Encounter for immunization: Secondary | ICD-10-CM | POA: Diagnosis not present

## 2017-06-04 DIAGNOSIS — R7309 Other abnormal glucose: Secondary | ICD-10-CM | POA: Diagnosis not present

## 2017-06-04 DIAGNOSIS — E782 Mixed hyperlipidemia: Secondary | ICD-10-CM | POA: Diagnosis not present

## 2017-06-04 DIAGNOSIS — Z0000 Encounter for general adult medical examination without abnormal findings: Secondary | ICD-10-CM | POA: Diagnosis not present

## 2017-06-04 DIAGNOSIS — F322 Major depressive disorder, single episode, severe without psychotic features: Secondary | ICD-10-CM | POA: Diagnosis not present

## 2017-06-21 ENCOUNTER — Other Ambulatory Visit: Payer: Self-pay | Admitting: Nurse Practitioner

## 2017-06-21 DIAGNOSIS — B182 Chronic viral hepatitis C: Secondary | ICD-10-CM | POA: Diagnosis not present

## 2017-06-21 DIAGNOSIS — K7469 Other cirrhosis of liver: Secondary | ICD-10-CM | POA: Diagnosis not present

## 2017-06-22 DIAGNOSIS — H10413 Chronic giant papillary conjunctivitis, bilateral: Secondary | ICD-10-CM | POA: Diagnosis not present

## 2017-06-24 ENCOUNTER — Ambulatory Visit
Admission: RE | Admit: 2017-06-24 | Discharge: 2017-06-24 | Disposition: A | Discharge status: Home / Self Care | Payer: BLUE CROSS/BLUE SHIELD | Source: Ambulatory Visit | Attending: Nurse Practitioner | Admitting: Nurse Practitioner

## 2017-06-24 DIAGNOSIS — K7689 Other specified diseases of liver: Secondary | ICD-10-CM | POA: Diagnosis not present

## 2017-06-24 DIAGNOSIS — K7469 Other cirrhosis of liver: Secondary | ICD-10-CM

## 2017-11-23 ENCOUNTER — Encounter: Payer: Self-pay | Admitting: *Deleted

## 2017-12-13 DIAGNOSIS — K7469 Other cirrhosis of liver: Secondary | ICD-10-CM | POA: Diagnosis not present

## 2017-12-15 ENCOUNTER — Other Ambulatory Visit: Payer: Self-pay | Admitting: Nurse Practitioner

## 2017-12-15 DIAGNOSIS — K7469 Other cirrhosis of liver: Secondary | ICD-10-CM

## 2017-12-30 ENCOUNTER — Ambulatory Visit
Admission: RE | Admit: 2017-12-30 | Discharge: 2017-12-30 | Disposition: A | Discharge status: Home / Self Care | Payer: BLUE CROSS/BLUE SHIELD | Source: Ambulatory Visit | Attending: Nurse Practitioner | Admitting: Nurse Practitioner

## 2017-12-30 DIAGNOSIS — K7460 Unspecified cirrhosis of liver: Secondary | ICD-10-CM | POA: Diagnosis not present

## 2017-12-30 DIAGNOSIS — K7469 Other cirrhosis of liver: Secondary | ICD-10-CM

## 2018-01-12 DIAGNOSIS — D1721 Benign lipomatous neoplasm of skin and subcutaneous tissue of right arm: Secondary | ICD-10-CM | POA: Diagnosis not present

## 2018-01-12 DIAGNOSIS — L821 Other seborrheic keratosis: Secondary | ICD-10-CM | POA: Diagnosis not present

## 2018-01-12 DIAGNOSIS — D171 Benign lipomatous neoplasm of skin and subcutaneous tissue of trunk: Secondary | ICD-10-CM | POA: Diagnosis not present

## 2018-01-12 DIAGNOSIS — D1722 Benign lipomatous neoplasm of skin and subcutaneous tissue of left arm: Secondary | ICD-10-CM | POA: Diagnosis not present

## 2018-03-11 ENCOUNTER — Ambulatory Visit: Payer: Self-pay | Admitting: Surgery

## 2018-03-11 DIAGNOSIS — D1721 Benign lipomatous neoplasm of skin and subcutaneous tissue of right arm: Secondary | ICD-10-CM | POA: Diagnosis not present

## 2018-03-11 DIAGNOSIS — D171 Benign lipomatous neoplasm of skin and subcutaneous tissue of trunk: Secondary | ICD-10-CM | POA: Diagnosis not present

## 2018-03-11 DIAGNOSIS — D1722 Benign lipomatous neoplasm of skin and subcutaneous tissue of left arm: Secondary | ICD-10-CM | POA: Diagnosis not present

## 2018-03-11 NOTE — H&P (Signed)
History of Present Illness Imogene Burn. Sollie Vultaggio MD; 03/11/2018 12:24 PM) The patient is a 62 year old male who presents with a complaint of Mass. Referred by Dr. Elvera Lennox for multiple subcutaneous masses PCP - Wenda Low, MD  This is a 62 year old male in good health who presents with a long history of multiple subcutaneous masses over his torso and upper extremities. Some of these have been excised in the past. The patient has several that have become fairly large and tender. He also has a lot of tenderness along his left chest wall. Previous chest x-ray was unremarkable. He has a history of chronic hepatitis C.    Past Surgical History Dalbert Mayotte, [place]; 03/11/2018 10:54 AM) No pertinent past surgical history  Allergies Dalbert Mayotte, [place]; 03/11/2018 10:55 AM) No Known Drug Allergies [03/11/2018]:  Medication History Dalbert Mayotte, [place]; 03/11/2018 10:55 AM) No Current Medications  Family History Dalbert Mayotte, [place]; 03/11/2018 10:54 AM) Diabetes Mellitus Mother. Respiratory Condition Father.  Other Problems Dalbert Mayotte, CMA; 03/11/2018 10:54 AM) Hepatitis     Review of Systems Dalbert Mayotte CMA; 03/11/2018 10:54 AM) General Not Present- Appetite Loss, Chills, Fatigue, Fever, Night Sweats, Weight Gain and Weight Loss. HEENT Present- Ringing in the Ears and Visual Disturbances. Not Present- Earache, Hearing Loss, Hoarseness, Nose Bleed, Oral Ulcers, Seasonal Allergies, Sinus Pain, Sore Throat, Wears glasses/contact lenses and Yellow Eyes. Respiratory Present- Snoring. Not Present- Bloody sputum, Chronic Cough, Difficulty Breathing and Wheezing. Breast Present- Breast Mass and Breast Pain. Not Present- Nipple Discharge and Skin Changes. Cardiovascular Present- Swelling of Extremities. Not Present- Chest Pain, Difficulty Breathing Lying Down, Leg Cramps, Palpitations, Rapid Heart Rate and Shortness of Breath. Gastrointestinal Present- Excessive gas and Hemorrhoids. Not  Present- Abdominal Pain, Bloating, Bloody Stool, Change in Bowel Habits, Chronic diarrhea, Constipation, Difficulty Swallowing, Gets full quickly at meals, Indigestion, Nausea, Rectal Pain and Vomiting. Neurological Present- Decreased Memory. Not Present- Fainting, Headaches, Numbness, Seizures, Tingling, Tremor, Trouble walking and Weakness. Psychiatric Not Present- Anxiety, Bipolar, Change in Sleep Pattern, Depression, Fearful and Frequent crying. Endocrine Present- Cold Intolerance and Excessive Hunger. Not Present- Hair Changes, Heat Intolerance, Hot flashes and New Diabetes. Hematology Not Present- Blood Thinners, Easy Bruising, Excessive bleeding, Gland problems, HIV and Persistent Infections.  Vitals Dalbert Mayotte CMA; 03/11/2018 10:55 AM) 03/11/2018 10:55 AM Weight: 204.2 lb Height: 74in Body Surface Area: 2.19 m Body Mass Index: 26.22 kg/m  Temp.: 98.99F  Pulse: 69 (Regular)  BP: 150/90 (Sitting, Left Arm, Standard)      Physical Exam Rodman Key K. Gabby Rackers MD; 03/11/2018 12:27 PM)  The physical exam findings are as follows: Note:WDWN in NAD Right forearm - 2 cm firm, mobile, smooth subcutaneous mass Left upper arm - 2 cm firm, mobile, smooth subcutaneous mass; multiple smaller masses along left biceps His anterior torso has multiple visible subcutaneous masses. There is a grouping of four adjacent masses in the epigastrium, measuring roughly 4, 2, 2, and 2 cm. Below this are two larger subcutaneous masses 4 cm and 5 cm. These all exhibit some tenderness and he would like to have them removed. There are multiple other palpable subcutaneous masses over the anterior abdominal wall that are unremarkable and will be left alone for now. He has a lot of tenderness along the ribs of the left chest wall. He has a 2 cm firm palpable mass in this area.    Assessment & Plan Imogene Burn. 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 The surgical procedure has been discussed with the patient. Potential risks, benefits, alternative treatments, and expected outcomes have been explained. All of the patient's questions at this time have been answered. The likelihood of reaching the patient's treatment goal is good. The patient understand the proposed surgical procedure and wishes to proceed.  Imogene Burn. Georgette Dover, MD, [facility]  General/ Trauma Surgery Beeper [phone]  03/11/2018 12:28 PM

## 2018-04-07 DIAGNOSIS — L300 Nummular dermatitis: Secondary | ICD-10-CM | POA: Diagnosis not present

## 2018-04-12 ENCOUNTER — Other Ambulatory Visit: Payer: Self-pay | Admitting: Surgery

## 2018-04-12 DIAGNOSIS — D1721 Benign lipomatous neoplasm of skin and subcutaneous tissue of right arm: Secondary | ICD-10-CM | POA: Diagnosis not present

## 2018-04-12 DIAGNOSIS — D179 Benign lipomatous neoplasm, unspecified: Secondary | ICD-10-CM | POA: Diagnosis not present

## 2018-04-12 DIAGNOSIS — D171 Benign lipomatous neoplasm of skin and subcutaneous tissue of trunk: Secondary | ICD-10-CM | POA: Diagnosis not present

## 2018-04-12 DIAGNOSIS — D1722 Benign lipomatous neoplasm of skin and subcutaneous tissue of left arm: Secondary | ICD-10-CM | POA: Diagnosis not present

## 2018-06-06 DIAGNOSIS — K7469 Other cirrhosis of liver: Secondary | ICD-10-CM | POA: Diagnosis not present

## 2018-06-07 DIAGNOSIS — E782 Mixed hyperlipidemia: Secondary | ICD-10-CM | POA: Diagnosis not present

## 2018-06-07 DIAGNOSIS — Z1389 Encounter for screening for other disorder: Secondary | ICD-10-CM | POA: Diagnosis not present

## 2018-06-07 DIAGNOSIS — Z125 Encounter for screening for malignant neoplasm of prostate: Secondary | ICD-10-CM | POA: Diagnosis not present

## 2018-06-07 DIAGNOSIS — Z23 Encounter for immunization: Secondary | ICD-10-CM | POA: Diagnosis not present

## 2018-06-07 DIAGNOSIS — Z0000 Encounter for general adult medical examination without abnormal findings: Secondary | ICD-10-CM | POA: Diagnosis not present

## 2018-06-08 ENCOUNTER — Other Ambulatory Visit: Payer: Self-pay | Admitting: Nurse Practitioner

## 2018-06-08 DIAGNOSIS — K7469 Other cirrhosis of liver: Secondary | ICD-10-CM

## 2018-06-16 ENCOUNTER — Ambulatory Visit
Admission: RE | Admit: 2018-06-16 | Discharge: 2018-06-16 | Disposition: A | Discharge status: Home / Self Care | Payer: BLUE CROSS/BLUE SHIELD | Source: Ambulatory Visit | Attending: Nurse Practitioner | Admitting: Nurse Practitioner

## 2018-06-16 DIAGNOSIS — K7469 Other cirrhosis of liver: Secondary | ICD-10-CM

## 2018-06-16 DIAGNOSIS — K7460 Unspecified cirrhosis of liver: Secondary | ICD-10-CM | POA: Diagnosis not present

## 2018-08-22 DIAGNOSIS — M79671 Pain in right foot: Secondary | ICD-10-CM | POA: Diagnosis not present

## 2018-08-23 DIAGNOSIS — S92424A Nondisplaced fracture of distal phalanx of right great toe, initial encounter for closed fracture: Secondary | ICD-10-CM | POA: Diagnosis not present

## 2018-08-23 DIAGNOSIS — M79671 Pain in right foot: Secondary | ICD-10-CM | POA: Diagnosis not present

## 2018-08-30 DIAGNOSIS — S92424A Nondisplaced fracture of distal phalanx of right great toe, initial encounter for closed fracture: Secondary | ICD-10-CM | POA: Diagnosis not present

## 2018-09-12 DIAGNOSIS — R079 Chest pain, unspecified: Secondary | ICD-10-CM | POA: Diagnosis not present

## 2018-09-12 DIAGNOSIS — I499 Cardiac arrhythmia, unspecified: Secondary | ICD-10-CM | POA: Diagnosis not present

## 2018-09-12 DIAGNOSIS — R0789 Other chest pain: Secondary | ICD-10-CM | POA: Diagnosis not present

## 2018-09-12 DIAGNOSIS — R001 Bradycardia, unspecified: Secondary | ICD-10-CM | POA: Diagnosis not present

## 2019-02-13 DIAGNOSIS — K7469 Other cirrhosis of liver: Secondary | ICD-10-CM | POA: Diagnosis not present

## 2019-02-16 ENCOUNTER — Other Ambulatory Visit: Payer: Self-pay | Admitting: Nurse Practitioner

## 2019-02-16 DIAGNOSIS — K7469 Other cirrhosis of liver: Secondary | ICD-10-CM

## 2019-02-21 ENCOUNTER — Ambulatory Visit
Admission: RE | Admit: 2019-02-21 | Discharge: 2019-02-21 | Disposition: A | Discharge status: Home / Self Care | Payer: BLUE CROSS/BLUE SHIELD | Source: Ambulatory Visit | Attending: Nurse Practitioner | Admitting: Nurse Practitioner

## 2019-02-21 DIAGNOSIS — K7469 Other cirrhosis of liver: Secondary | ICD-10-CM

## 2019-02-21 DIAGNOSIS — K769 Liver disease, unspecified: Secondary | ICD-10-CM | POA: Diagnosis not present

## 2019-05-26 DIAGNOSIS — K7460 Unspecified cirrhosis of liver: Secondary | ICD-10-CM | POA: Diagnosis not present

## 2019-05-26 DIAGNOSIS — Z23 Encounter for immunization: Secondary | ICD-10-CM | POA: Diagnosis not present

## 2019-05-26 DIAGNOSIS — M545 Low back pain: Secondary | ICD-10-CM | POA: Diagnosis not present

## 2019-06-07 DIAGNOSIS — Z8619 Personal history of other infectious and parasitic diseases: Secondary | ICD-10-CM | POA: Diagnosis not present

## 2019-06-07 DIAGNOSIS — K7460 Unspecified cirrhosis of liver: Secondary | ICD-10-CM | POA: Diagnosis not present

## 2019-06-22 DIAGNOSIS — R519 Headache, unspecified: Secondary | ICD-10-CM | POA: Diagnosis not present

## 2019-06-22 DIAGNOSIS — Z20828 Contact with and (suspected) exposure to other viral communicable diseases: Secondary | ICD-10-CM | POA: Diagnosis not present

## 2019-11-16 IMAGING — US ULTRASOUND ABDOMEN LIMITED
1 series · 14 of 25 positions shown · non-contrast
Comparison: 06/16/2018

CLINICAL DATA: [place] cirrhosis

EXAM:
ULTRASOUND ABDOMEN LIMITED RIGHT UPPER QUADRANT

[Series 1: ultrasound abdomen limited · 0.17mm/px · 14 of 41 slices shown]
[im 1/41]
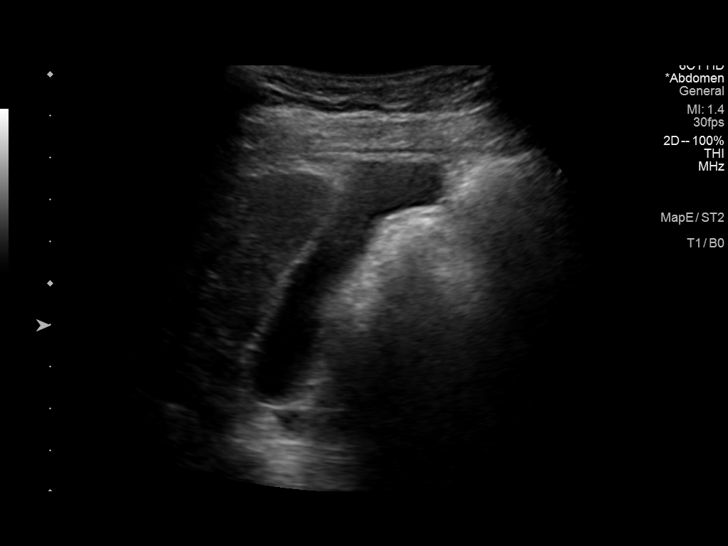
[im 4/41]
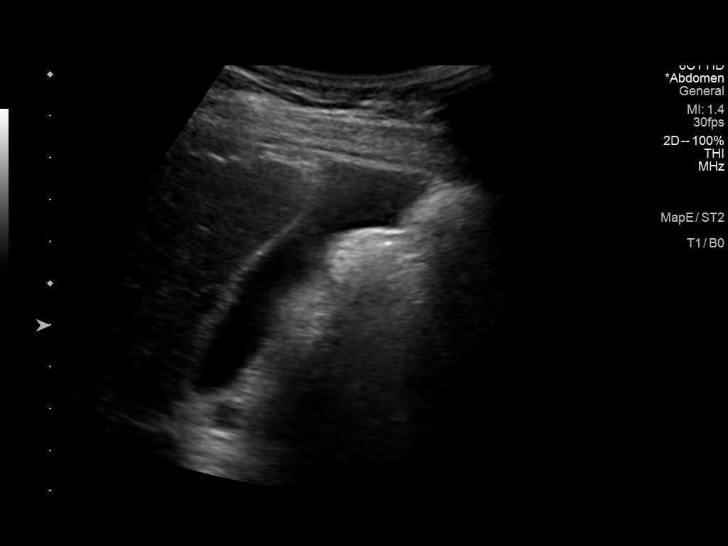
[im 7/41]
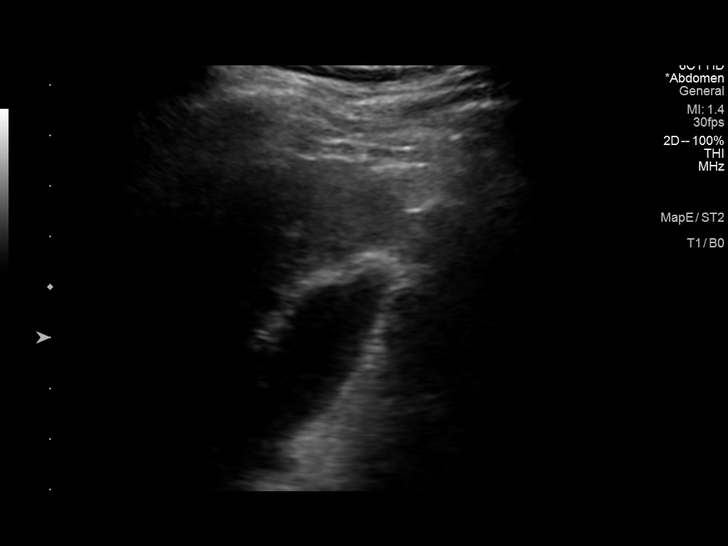
[im 11/41]
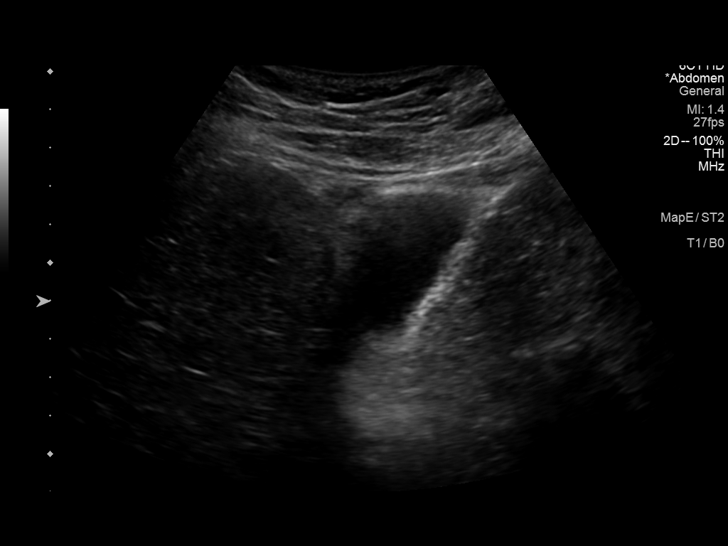
[im 14/41]
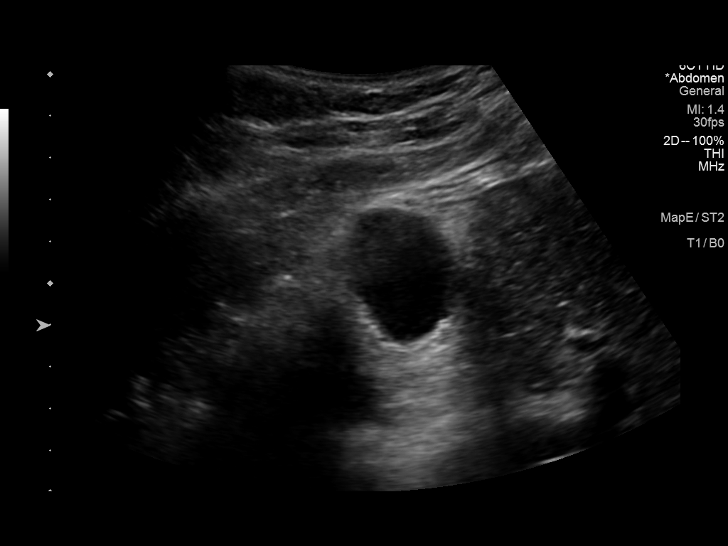
[im 16/41]
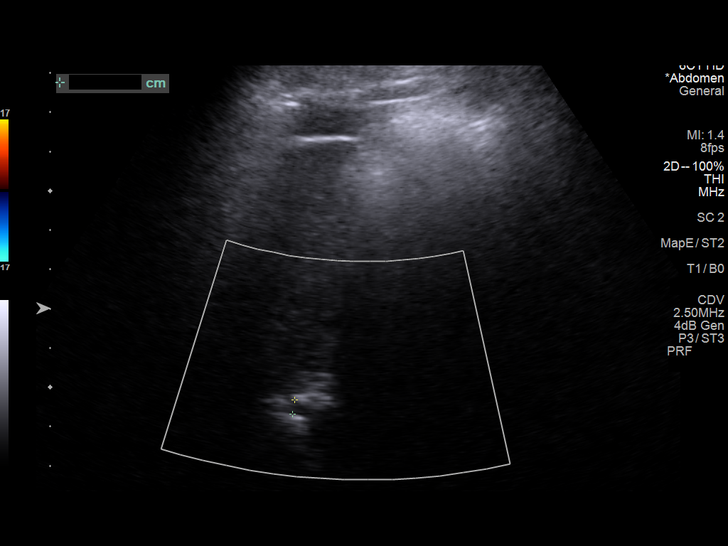
[im 19/41]
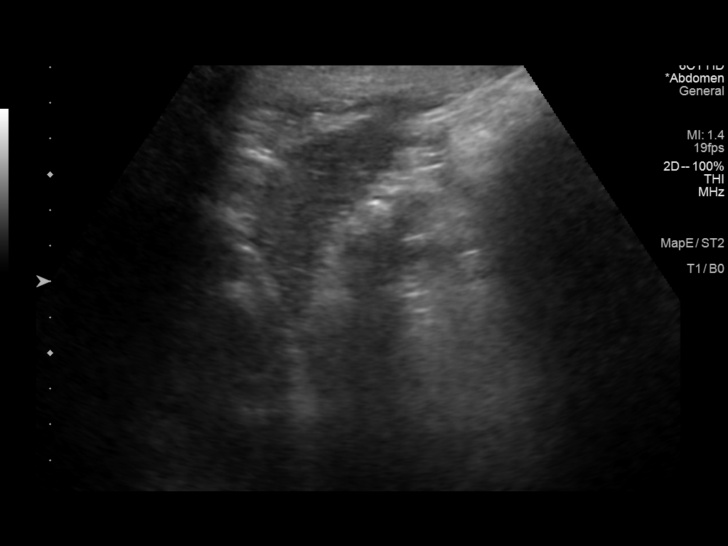
[im 22/41]
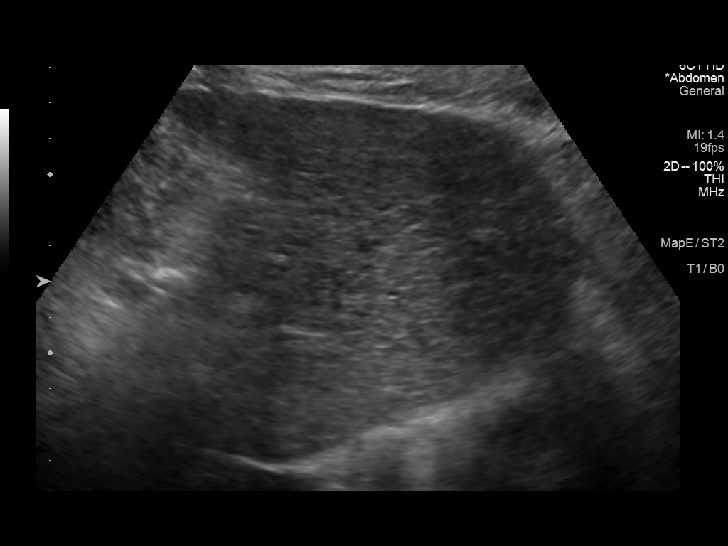
[im 26/41]
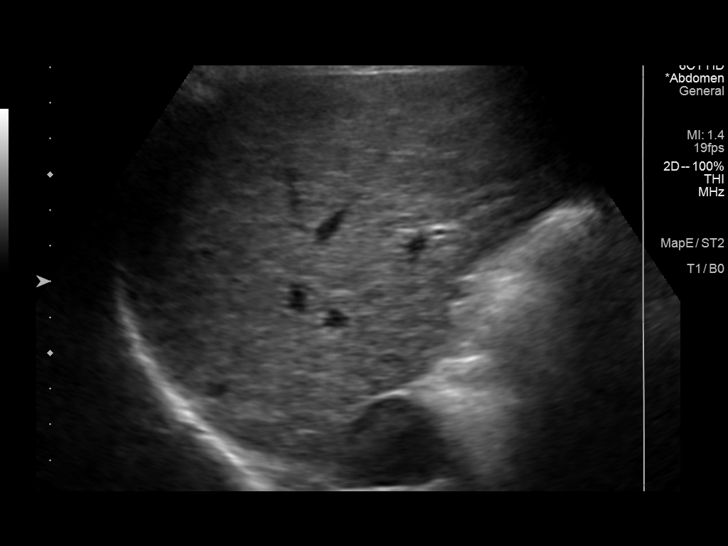
[im 27/41]
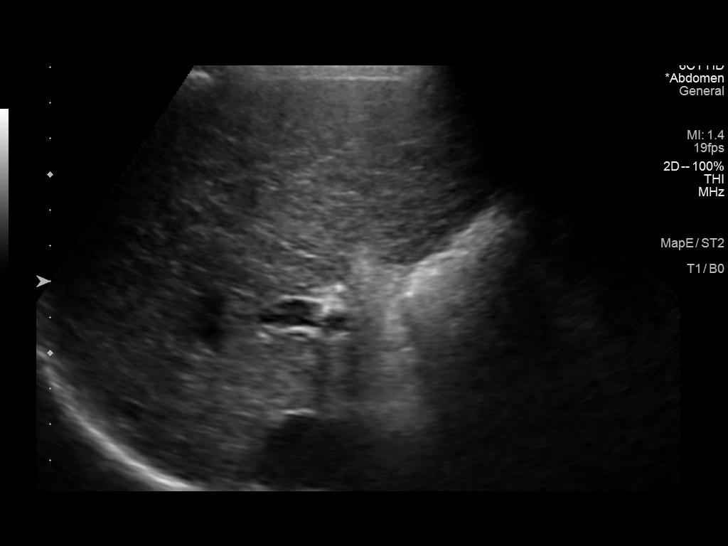
[im 31/41]
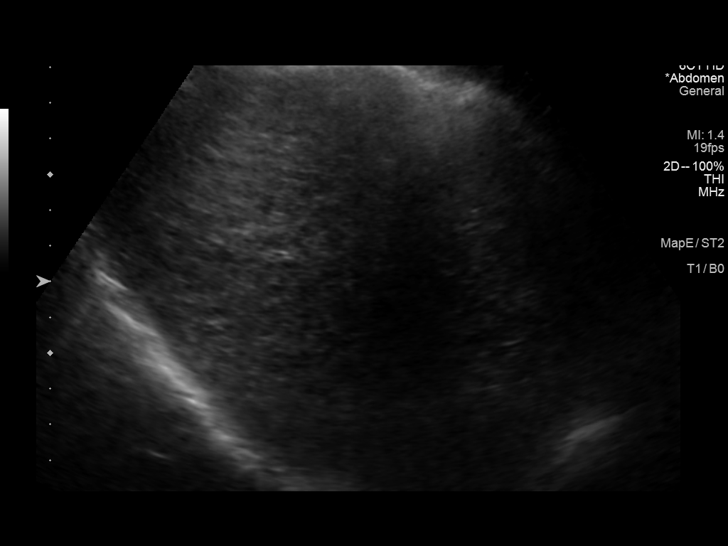
[im 34/41]
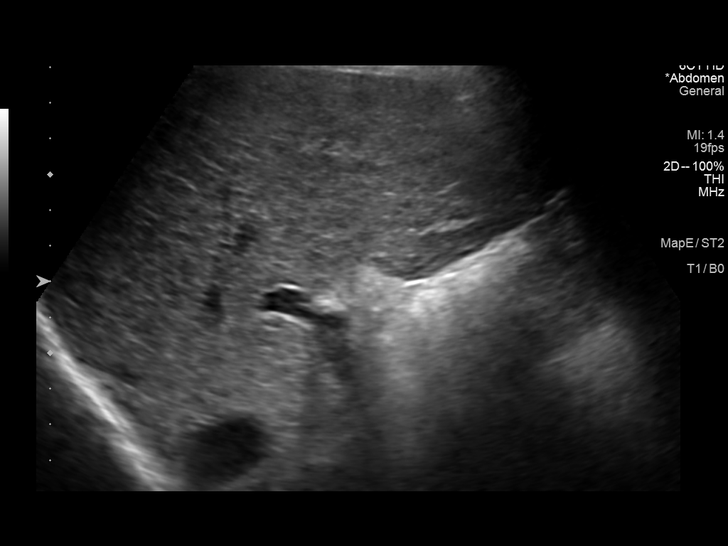
[im 37/41]
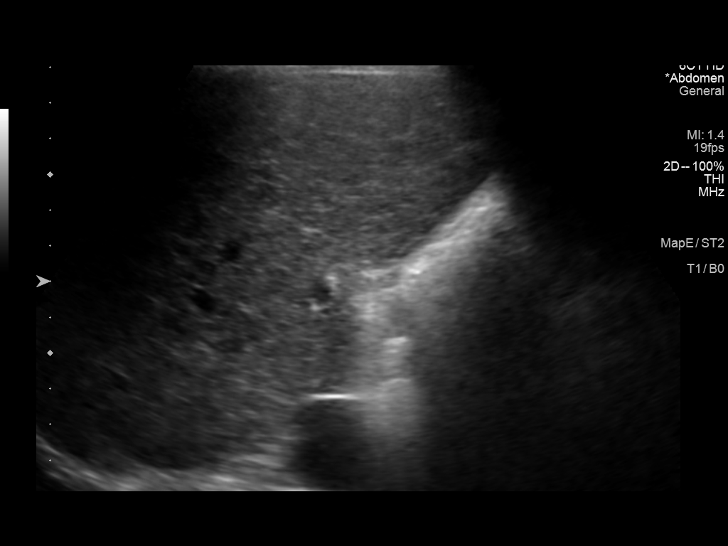
[im 41/41]
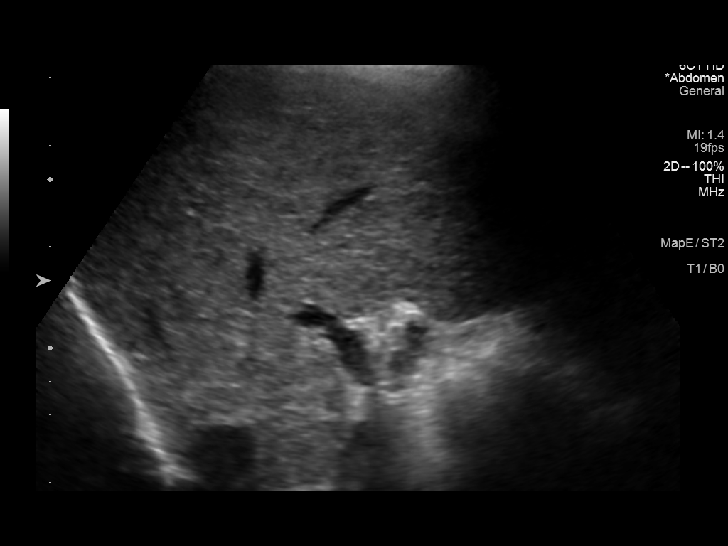

[14 of 25 positions shown; findings below may reference images not displayed]

FINDINGS: Gallbladder:

Normally distended without stones or wall thickening. No
pericholecystic fluid or sonographic Murphy sign.

Common bile duct:

Diameter: 4 mm diameter, normal

Liver:

Heterogeneous parenchymal echogenicity of the liver. Question subtle
contour nodularity. No focal hepatic mass lesion. Portal vein is
patent on color Doppler imaging with normal direction of blood flow
towards the liver.

Other: No RIGHT upper quadrant free fluid
IMPRESSION: Heterogeneous echogenicity of the liver with question subtle contour
nodularity of the liver, suggesting cirrhosis.

No definite hepatic mass lesion visualized.
